# Patient Record
Sex: Male | Born: 1940 | Race: White | Hispanic: No | Marital: Married | Smoking: Never smoker
Health system: Southern US, Community
[De-identification: ages and names within clinical notes are randomized; demographics above are authoritative.]

## PROBLEM LIST (undated history)

## (undated) DIAGNOSIS — I10 Essential (primary) hypertension: Secondary | ICD-10-CM

## (undated) DIAGNOSIS — N200 Calculus of kidney: Secondary | ICD-10-CM

---

## 2008-05-16 HISTORY — PX: KNEE SURGERY: SHX244

## 2008-07-28 ENCOUNTER — Inpatient Hospital Stay (HOSPITAL_COMMUNITY): Admission: RE | Admit: 2008-07-28 | Discharge: 2008-07-31 | Payer: Self-pay | Admitting: Orthopedic Surgery

## 2009-06-10 ENCOUNTER — Ambulatory Visit (HOSPITAL_COMMUNITY): Admission: RE | Admit: 2009-06-10 | Discharge: 2009-06-10 | Payer: Self-pay | Admitting: Orthopedic Surgery

## 2010-08-26 LAB — URINALYSIS, ROUTINE W REFLEX MICROSCOPIC
Bilirubin Urine: NEGATIVE
Glucose, UA: NEGATIVE mg/dL
Ketones, ur: NEGATIVE mg/dL
Leukocytes, UA: NEGATIVE
Protein, ur: NEGATIVE mg/dL
pH: 5.5 (ref 5.0–8.0)

## 2010-08-26 LAB — BASIC METABOLIC PANEL
BUN: 16 mg/dL (ref 6–23)
BUN: 25 mg/dL — ABNORMAL HIGH (ref 6–23)
Calcium: 9.8 mg/dL (ref 8.4–10.5)
Chloride: 108 mEq/L (ref 96–112)
Creatinine, Ser: 1.21 mg/dL (ref 0.4–1.5)
Creatinine, Ser: 1.2 mg/dL (ref 0.4–1.5)
GFR calc Af Amer: >60 mL/min (ref >60)
GFR calc Af Amer: >60 mL/min (ref >60)
GFR calc non Af Amer: >60 mL/min (ref >60)
Glucose, Bld: 118 mg/dL — ABNORMAL HIGH (ref 70–99)

## 2010-08-26 LAB — DIFFERENTIAL
Basophils Absolute: 0 10*3/uL (ref 0.0–0.1)
Eosinophils Absolute: 0.2 10*3/uL (ref 0.0–0.7)
Eosinophils Relative: 4 % (ref 0–5)
Lymphocytes Relative: 25 % (ref 12–46)
Lymphs Abs: 1 10*3/uL (ref 0.7–4.0)
Neutrophils Relative %: 63 % (ref 43–77)

## 2010-08-26 LAB — CBC
HCT: 30.1 % — ABNORMAL LOW (ref 39.0–52.0)
HCT: 33 % — ABNORMAL LOW (ref 39.0–52.0)
HCT: 39.9 % (ref 39.0–52.0)
Hemoglobin: 11.1 g/dL — ABNORMAL LOW (ref 13.0–17.0)
MCHC: 35.3 g/dL (ref 30.0–36.0)
MCHC: 35.9 g/dL (ref 30.0–36.0)
MCV: 95.2 fL (ref 78.0–100.0)
Platelets: 121 10*3/uL — ABNORMAL LOW (ref 150–400)
RBC: 3.23 MIL/uL — ABNORMAL LOW (ref 4.22–5.81)
RBC: 4.19 MIL/uL — ABNORMAL LOW (ref 4.22–5.81)
RDW: 12.6 % (ref 11.5–15.5)
RDW: 12.8 % (ref 11.5–15.5)

## 2010-08-26 LAB — TYPE AND SCREEN

## 2010-08-26 LAB — URINE MICROSCOPIC-ADD ON

## 2010-08-26 LAB — APTT: aPTT: 32 seconds (ref 24–37)

## 2010-09-28 NOTE — Op Note (Signed)
NAME:  Ernest Daniels, Ernest Daniels                  ACCOUNT NO.:  [id number]   MEDICAL RECORD NO.:  [id number]          PATIENT TYPE:  INP   LOCATION:  5035                         FACILITY:  [facility]   PHYSICIAN:  Feliberto Gottron. Turner Daniels, M.D.   DATE OF BIRTH:  [birth year]   DATE OF PROCEDURE:  07/28/2008  DATE OF DISCHARGE:                               OPERATIVE REPORT   PREOPERATIVE DIAGNOSIS:  End-stage arthritis, left knee.   POSTOPERATIVE DIAGNOSIS:  End-stage arthritis, left knee.   PROCEDURE:  Left total knee arthroplasty using DePuy Sigma RP  components, 5 left femur, 6 tibia, 10-mm Sigma RP bearing, 41-mm  patellar button, double batch of DePuy HV cement with 1500 mg Zinacef,  all components cemented.   SURGEON:  Feliberto Gottron. Turner Daniels, MD   FIRST ASSISTANT:  Shirl Harris, PA-C   ANESTHETIC:  General endotracheal.   ESTIMATED BLOOD LOSS:  Minimal.   FLUID REPLACEMENT:  1500 mL of crystalloid.   DRAINS PLACED:  Foley catheter and 2 medium hemostats.   URINE OUTPUT:  300 mL.   TOURNIQUET TIME:  1 hour and 40 minutes.   INDICATIONS FOR PROCEDURE:  A 70 year old gentleman with end-stage  arthritis of the left knee who has been treated conservatively with anti-  inflammatory medicines, physical therapy, cortisone injections, and  viscosupplementation.  He still has persistent unremitting pain.  He  desires left total knee arthroplasty to decrease pain and increase  function.  Plain radiographs show these down to bone-on-bone arthritic  changes.  The risks and benefits of surgery have been discussed.  Questions are answered.  He is prepared for intervention.   DESCRIPTION OF PROCEDURE:  The patient was identified by armband and  underwent a left femoral nerve block anesthetic in the block area at  [facility].  He then received preoperative IV antibiotics and  was taken to the operating room #5 where the appropriate site monitors  were reattached and general endotracheal anesthesia was  induced.  Foley  catheter was inserted.  Tourniquet applied high at the left thigh and  the left lower extremity was then set with a foot positioner and lateral  post, prepped and draped in the usual sterile fashion from the ankle to  the tourniquet.  A standard time-out procedure was performed.  The limb  was wrapped with an Esmarch bandage.  Tourniquet was inflated to 350  mmHg, and we began the procedure by making an anterior midline incision  centered over the patella starting handbreadth above the patella going 1  cm medial to and 3 cm distal to the tibial tubercle.  Small bleeders in  the skin and subcutaneous tissue were identified and cauterized.  The  transverse retinaculum over the patella was incised, reflected medially  allowing a medial parapatellar arthrotomy.  The patella was everted.  Prepatellar fat pad was resected.  Superficial medial collateral  ligament was elevated off the proximal tibia from anterior-posterior  keeping it intact distally and allowing lengthening of the superficial  medial collateral ligament because of the varus deformity.  The knee was  then hyperflexed exposing the  cruciate ligaments which were excised with  electrocautery as were the medial and lateral menisci and the notch was  widened with a 0.5-inch osteotome to allow placement of a posterior  McCulloch retractor through the notch.  A posteromedial Z-retractor was  also placed and finally a lateral Homan retractor.  This enhanced our  proximal tibial exposure.  The proximal tibia was then entered with a  DePuy step drill followed by the IM rod set for 2 degrees posterior  slope cut.  This was pinned into place allowing removal of 7-8 mm of  bone medially and about 10 mm of bone laterally using the power  oscillating saw.  We then entered the distal femur 2 mm anterior to the  PCL origin with the step drill followed by the IM rod set for a 5-degree  left femoral cut because of the relatively  large knee.  We set it for 12  mm resection as well as the 15-degree flexion contracture he had  preoperatively.  After we performed the distal femoral cut, we sized for  a #5 left femoral component and set the cutting guide in 3 degrees of  external rotation.  We then performed our anterior, posterior, and  chamfer cuts without difficulty followed by the Sigma RP box cut after  pinning the box cutting guide into place.  The patella was then measured  at 27 mm.  We thought it would be a large a patella, set the cutting  guide at 16 and removed the posterior 11 mm of the patella sized for 41  button and drilled the lollipop.  We then hyperflexed the knee once  again and sized for 6 tibial baseplate.  The trial was pinned into place  followed by the smokestack in the reamer and then the Delta fin keel  punch.  At this point, we hammered into place a #5 left trial femur,  drilled the lugs and then performed a reduction with a 10-mm Sigma RP  trial bearing and a 41-mm trial patella.  All components tracked  smoothly and no thumb pressure was required for the patella.  The trial  components were removed.  All bony surfaces were WaterPik cleaned and  dried with suction and sponges and a double batch of DePuy HV cement  with 1500 mg of Zinacef was then mixed and applied to all bony and  metallic mating surfaces except for the posterior condyles of the femur  itself.  In order, we hammered into place a #6 tibial baseplate and  removed excess cement, a 5 left femoral component and removed excess  cement, and the clamped the 41-mm patellar button on the patella and  again removed excess cement.  The 10-mm Sigma RP bearing was then  selected, snapped into place, the knee reduced and held in 5 degrees of  flexion with compression as the cement cured.  Medium Hemovac drains  were placed deep in the wound which was then lavaged one more time with  normal saline solution.  We checked our patellar  tracking one more time  after the cement cured and then closed the parapatellar arthrotomy with  running #1 Vicryl suture, the subcutaneous tissue with 0 and 2-0 undyed  Vicryl suture, and skin with skin staples.  A dressing of Xeroform, 4 x  4 dressing sponges, Webril, and Ace wrap was then applied.  The  tourniquet was let down.  The patient awakened and taken to the recovery  room without difficulty.  Feliberto Gottron. Turner Daniels, M.D.  Electronically Signed     FJR/MEDQ  D:  07/28/2008  T:  07/28/2008  Job:  [id number]

## 2010-10-01 NOTE — Discharge Summary (Signed)
NAME:  Ernest Daniels, Ernest Daniels                  ACCOUNT NO.:  [id number]   MEDICAL RECORD NO.:  [id number]          Daniels TYPE:  INP   LOCATION:  5035                         FACILITY:  [facility]   PHYSICIAN:  Feliberto Gottron. Turner Daniels, M.D.   DATE OF BIRTH:  07/16/1940   DATE OF ADMISSION:  07/28/2008  DATE OF DISCHARGE:  07/31/2008                               DISCHARGE SUMMARY   CHIEF COMPLAINT:  Left knee pain.   HISTORY OF PRESENT ILLNESS:  This is a 70 year old gentleman who  complains of unremitting pain in his left knee despite conservative  treatment with antiinflammatory steroid injection, discussed  supplementation and pain medication.  He desires a surgical intervention  at this time.  All risk and benefits of surgery were discussed with Ernest  Daniels.   PAST MEDICAL HISTORY:  Hypertension and kidney stones.   PAST SURGICAL HISTORY:  He had an appendectomy and a hernia repair.   SOCIAL HISTORY:  He denies Ernest use of tobacco or alcohol.   FAMILY HISTORY:  Significant for arthritis.  He has no known drug  allergies.   CURRENT MEDICATIONS:  1. Potassium citrate 1000 mg 1 p.o. b.i.d.  2. Diclofenac 75 mg 1 p.o. daily.  3. Bupropion SR 150 mg 1 p.o. daily.  4. Lisinopril 20 mg 1 p.o. daily.  5. Simvastatin 10 mg 1 p.o. daily.  6. Lorazepam 0.5 mg 1 p.o. daily.  7. Glucosamine 6000 mg 1 p.o. daily.  8. Niacin 2000 mg 1 p.o. daily.  9. Fish oil 1400 mg 1 p.o. daily.  10.Metamucil 1 p.o. daily.   PHYSICAL EXAMINATION:  Gross examination of Ernest left knee demonstrated  Ernest Daniels has a 5-degree flexion contracture.  He has medial joint  line tenderness and has trace effusion.  He is neurovascularly intact.  X-rays demonstrate bone-on-bone degenerative joint disease of Ernest left  knee.   PREOP LABS:  White blood cells 3.9, red blood cells 4.19, hemoglobin  14.4, hematocrit 39.9, and platelet 122.  PT 13.4, INR 1.0, PTT 32.  Sodium 143, potassium 5.0, chloride 108, glucose 142, BUN 25,  creatinine  1.2.  Urinalysis demonstrates small hemoglobin, otherwise within normal  limits.   HOSPITAL COURSE:  Ernest Daniels was admitted to [facility] on July 28, 2008 when he underwent left total knee arthroplasty, that procedure was  performed by Dr. Gean Birchwood and Ernest Daniels tolerated that well.  Two  Hemovac drains were placed into Ernest left knee.  A perioperative Foley  catheter was placed.  Ernest Daniels was placed on Lovenox and Coumadin for  DVT prophylaxis and transferred to Ernest orthopedic floor.  On Ernest first  postoperative day, Ernest Daniels was awake and alert.  He denied any  nausea or vomiting.  He was tolerating p.o. intake well.  Hemoglobin was  11.9.  Surgical drains were removed and his Foley catheter was taken out  after physical therapy.  On Ernest second postoperative day, Ernest Daniels  was awake and alert and eating well.  He had ambulated 100 feet with  physical therapy on Ernest previous day, his  surgical dressing was changed  and this incision was benign.  Hemoglobin was 11.1.  On Ernest third  postoperative day, Ernest Daniels was eating well and ambulating  independently.  His surgical incision remained benign.  Hemoglobin was  10.6 and he was discharged to home.   DISPOSITION:  Ernest Daniels was discharged to home on July 31, 2008.  Home health would manage his wound, Coumadin, and physical therapy.  He  would return to Ernest clinic in 1 week to see Dr. Turner Daniels for x-rays and  staple removal.  He was weightbearing as tolerated.   DISCHARGE MEDICATIONS:  Discharge medicines were as per Ernest HMR with Ernest  addition of Percocet 5 mg 1-2 tabs p.o. q.4 hours p.r.n. pain, Robaxin  500 mg 1 p.o. b.i.d. p.r.n. spasm, and Coumadin to take as directed with  a target INR of 1.5 to 2 for 14 days.   FINAL DIAGNOSIS:  End-stage degenerative joint disease of Ernest left knee.      Shirl Harris, PA      Feliberto Gottron. Turner Daniels, M.D.  Electronically Signed    JW/MEDQ  D:  09/08/2008   T:  09/09/2008  Job:  [id number]

## 2012-09-19 ENCOUNTER — Emergency Department (HOSPITAL_BASED_OUTPATIENT_CLINIC_OR_DEPARTMENT_OTHER)
Admission: EM | Admit: 2012-09-19 | Discharge: 2012-09-19 | Disposition: A | Discharge status: Home / Self Care | Payer: BC Managed Care – PPO | Attending: Emergency Medicine | Admitting: Emergency Medicine

## 2012-09-19 ENCOUNTER — Encounter (HOSPITAL_BASED_OUTPATIENT_CLINIC_OR_DEPARTMENT_OTHER): Payer: Self-pay | Admitting: *Deleted

## 2012-09-19 DIAGNOSIS — R509 Fever, unspecified: Secondary | ICD-10-CM | POA: Insufficient documentation

## 2012-09-19 DIAGNOSIS — J029 Acute pharyngitis, unspecified: Secondary | ICD-10-CM | POA: Insufficient documentation

## 2012-09-19 DIAGNOSIS — Z79899 Other long term (current) drug therapy: Secondary | ICD-10-CM | POA: Insufficient documentation

## 2012-09-19 DIAGNOSIS — I10 Essential (primary) hypertension: Secondary | ICD-10-CM | POA: Insufficient documentation

## 2012-09-19 HISTORY — DX: Essential (primary) hypertension: I10

## 2012-09-19 LAB — BASIC METABOLIC PANEL
CO2: 24 mEq/L (ref 19–32)
Calcium: 9.7 mg/dL (ref 8.4–10.5)
Chloride: 101 mEq/L (ref 96–112)
Glucose, Bld: 108 mg/dL — ABNORMAL HIGH (ref 70–99)
Sodium: 137 mEq/L (ref 135–145)

## 2012-09-19 LAB — CBC WITH DIFFERENTIAL/PLATELET
Basophils Relative: 0 % (ref 0–1)
Eosinophils Absolute: 0.3 10*3/uL (ref 0.0–0.7)
Eosinophils Relative: 3 % (ref 0–5)
Lymphocytes Relative: 7 % — ABNORMAL LOW (ref 12–46)
Neutrophils Relative %: 83 % — ABNORMAL HIGH (ref 43–77)
WBC: 11.2 10*3/uL — ABNORMAL HIGH (ref 4.0–10.5)

## 2012-09-19 LAB — RAPID STREP SCREEN (MED CTR MEBANE ONLY): Streptococcus, Group A Screen (Direct): NEGATIVE

## 2012-09-19 LAB — MONONUCLEOSIS SCREEN: Mono Screen: NEGATIVE

## 2012-09-19 MED ORDER — NYSTATIN 100000 UNIT/ML MT SUSP: 500000.0000 [IU] | Freq: Four times a day (QID) | OROMUCOSAL | Status: DC

## 2012-09-19 MED ORDER — MAGIC MOUTHWASH W/LIDOCAINE: 10.0000 mL | Freq: Four times a day (QID) | ORAL | Status: DC | PRN

## 2012-09-19 MED ORDER — DEXAMETHASONE SODIUM PHOSPHATE 10 MG/ML IJ SOLN
10.0000 mg | Freq: Once | INTRAMUSCULAR | Status: AC
  Administered 2012-09-19: 10 mg via INTRAVENOUS
  Filled 2012-09-19: qty 1

## 2012-09-19 MED ORDER — AMOXICILLIN-POT CLAVULANATE 875-125 MG PO TABS: 1.0000 | ORAL_TABLET | Freq: Two times a day (BID) | ORAL | Status: DC

## 2012-09-19 MED ORDER — SODIUM CHLORIDE 0.9 % IV BOLUS (SEPSIS)
500.0000 mL | Freq: Once | INTRAVENOUS | Status: AC
  Administered 2012-09-19: 500 mL via INTRAVENOUS

## 2012-09-19 MED ORDER — DEXTROSE 5 % IV SOLN
1.0000 g | Freq: Once | INTRAVENOUS | Status: AC
  Administered 2012-09-19: 1 g via INTRAVENOUS
  Filled 2012-09-19: qty 10

## 2012-09-19 NOTE — ED Notes (Signed)
Sore throat fever chills x 2-3 days unable to sleep at night

## 2012-09-19 NOTE — ED Provider Notes (Signed)
History     CSN: [id number]  Arrival date & time 09/19/12  0704   First MD Initiated Contact with Patient 09/19/12 0732      Chief Complaint  Patient presents with  . Sore Throat  . Fever    (Consider location/radiation/quality/duration/timing/severity/associated sxs/prior treatment) HPI Comments: Patient presents to the ER for evaluation of sore throat. Patient reports that his symptoms began approximately 3 days ago. He did see his doctor and had a rapid strep test performed. He reports that was negative. He has been using hydrocodone, but the pain is not controlled. He had trouble sleeping last night. He reports that the pain started on the left side, but now is the entire throat and has become severe. It hurts more when he swallows. He has been having fever and chills. He reports that he had bronchitis 2 weeks ago, was not treated with antibiotics.  Patient is a 72 y.o. male presenting with pharyngitis and fever.  Sore Throat  Fever Associated symptoms: sore throat     Past Medical History  Diagnosis Date  . Hypertension     History reviewed. No pertinent past surgical history.  History reviewed. No pertinent family history.  History  Substance Use Topics  . Smoking status: Never Smoker   . Smokeless tobacco: Not on file  . Alcohol Use: Yes      Review of Systems  Constitutional: Positive for fever.  HENT: Positive for sore throat.   All other systems reviewed and are negative.    Allergies  Review of patient's allergies indicates no known allergies.  Home Medications   Current Outpatient Rx  Name  Route  Sig  Dispense  Refill  . amLODipine (NORVASC) 5 MG tablet   Oral   Take 5 mg by mouth daily.         . diclofenac (VOLTAREN) 25 MG EC tablet   Oral   Take 25 mg by mouth 2 (two) times daily.         . diphenhydrAMINE (BENADRYL) 50 MG tablet   Oral   Take 50 mg by mouth at bedtime as needed for itching.         Marland Kitchen LORazepam (ATIVAN) 1 MG  tablet   Oral   Take 1 mg by mouth every 8 (eight) hours.         . potassium citrate (UROCIT-K) 10 MEQ (1080 MG) SR tablet   Oral   Take 10 mEq by mouth 3 (three) times daily with meals.         . simvastatin (ZOCOR) 40 MG tablet   Oral   Take 40 mg by mouth every evening.           BP 144/83  Pulse 94  Temp(Src) 100.1 F (37.8 C) (Oral)  Resp 18  Ht 5\' 11"  (1.803 m)  Wt 210 lb (95.255 kg)  BMI 29.3 kg/m2  SpO2 97%  Physical Exam  Constitutional: He is oriented to person, place, and time. He appears well-developed and well-nourished. No distress.  HENT:  Head: Normocephalic and atraumatic.  Right Ear: Hearing, tympanic membrane and ear canal normal.  Left Ear: Hearing, tympanic membrane and ear canal normal.  Nose: Nose normal.  Mouth/Throat: Mucous membranes are normal. Oropharyngeal exudate and posterior oropharyngeal erythema present. No tonsillar abscesses.    Eyes: Conjunctivae and EOM are normal. Pupils are equal, round, and reactive to light.  Neck: Normal range of motion. Neck supple.  Cardiovascular: Normal rate, regular rhythm, S1 normal and  S2 normal.  Exam reveals no gallop and no friction rub.   No murmur heard. Pulmonary/Chest: Effort normal and breath sounds normal. No respiratory distress. He exhibits no tenderness.  Abdominal: Soft. Normal appearance and bowel sounds are normal. There is no hepatosplenomegaly. There is no tenderness. There is no rebound, no guarding, no tenderness at McBurney's point and negative Murphy's sign. No hernia.  Musculoskeletal: Normal range of motion.  Neurological: He is alert and oriented to person, place, and time. He has normal strength. No cranial nerve deficit or sensory deficit. Coordination normal. GCS eye subscore is 4. GCS verbal subscore is 5. GCS motor subscore is 6.  Skin: Skin is warm, dry and intact. No rash noted. No cyanosis.  Psychiatric: He has a normal mood and affect. His speech is normal and behavior  is normal. Thought content normal.    ED Course  Procedures (including critical care time)  Labs Reviewed  CBC WITH DIFFERENTIAL - Abnormal; Notable for the following:    WBC 11.2 (*)    MCH 34.2 (*)    MCHC 37.6 (*)    Platelets 138 (*)    Neutrophils Relative 83 (*)    Lymphocytes Relative 7 (*)    Neutro Abs 9.3 (*)    All other components within normal limits  BASIC METABOLIC PANEL - Abnormal; Notable for the following:    Glucose, Bld 108 (*)    BUN 24 (*)    GFR calc non Af Amer 54 (*)    GFR calc Af Amer 63 (*)    All other components within normal limits  RAPID STREP SCREEN  STREP A DNA PROBE  MONONUCLEOSIS SCREEN   No results found.   Diagnosis: Pharyngitis    MDM  Patient comes to the ER with complaints of the febrile illness with progressively worsening sore throat. Examination revealed diffuse exudate in the areas of the tonsil and soft palate. With patient having a fever, bacterial and viral infection are most likely. Based on the morphology, cannot rule out candidal infection, the patient does not have any reason for fungal infection of the throat. That stress is negative, DNA probe is pending. Monospot was negative. Patient administered IV fluids, Rocephin and Decadron. Will continue Augmentin as an outpatient. Patient has hydrocodone to use for the pain. Will add Magic mouthwash. In addition, as the patient was given steroids here, will cover for fungal etiology with nystatin. Followup with primary doctor. Return to the ER if symptoms worsen.        Gilda Crease, MD 09/19/12 [phone]

## 2012-09-20 LAB — STREP A DNA PROBE: Group A Strep Probe: NEGATIVE

## 2012-11-28 ENCOUNTER — Emergency Department (HOSPITAL_BASED_OUTPATIENT_CLINIC_OR_DEPARTMENT_OTHER): Payer: BC Managed Care – PPO

## 2012-11-28 ENCOUNTER — Emergency Department (HOSPITAL_BASED_OUTPATIENT_CLINIC_OR_DEPARTMENT_OTHER)
Admission: EM | Admit: 2012-11-28 | Discharge: 2012-11-28 | Disposition: A | Discharge status: Home / Self Care | Payer: BC Managed Care – PPO | Attending: Emergency Medicine | Admitting: Emergency Medicine

## 2012-11-28 ENCOUNTER — Encounter (HOSPITAL_BASED_OUTPATIENT_CLINIC_OR_DEPARTMENT_OTHER): Payer: Self-pay

## 2012-11-28 DIAGNOSIS — Z79899 Other long term (current) drug therapy: Secondary | ICD-10-CM | POA: Insufficient documentation

## 2012-11-28 DIAGNOSIS — M542 Cervicalgia: Secondary | ICD-10-CM | POA: Insufficient documentation

## 2012-11-28 DIAGNOSIS — M549 Dorsalgia, unspecified: Secondary | ICD-10-CM | POA: Insufficient documentation

## 2012-11-28 DIAGNOSIS — I10 Essential (primary) hypertension: Secondary | ICD-10-CM | POA: Insufficient documentation

## 2012-11-28 DIAGNOSIS — Z792 Long term (current) use of antibiotics: Secondary | ICD-10-CM | POA: Insufficient documentation

## 2012-11-28 MED ORDER — IBUPROFEN 400 MG PO TABS
600.0000 mg | ORAL_TABLET | Freq: Once | ORAL | Status: AC
  Administered 2012-11-28: 600 mg via ORAL
  Filled 2012-11-28: qty 1

## 2012-11-28 MED ORDER — DIAZEPAM 5 MG PO TABS: 5.0000 mg | ORAL_TABLET | Freq: Four times a day (QID) | ORAL | Status: DC | PRN

## 2012-11-28 MED ORDER — IBUPROFEN 600 MG PO TABS: 600.0000 mg | ORAL_TABLET | Freq: Four times a day (QID) | ORAL | Status: DC | PRN

## 2012-11-28 MED ORDER — OXYCODONE-ACETAMINOPHEN 5-325 MG PO TABS: 1.0000 | ORAL_TABLET | Freq: Four times a day (QID) | ORAL | Status: DC | PRN

## 2012-11-28 MED ORDER — OXYCODONE-ACETAMINOPHEN 5-325 MG PO TABS
1.0000 | ORAL_TABLET | Freq: Once | ORAL | Status: AC
  Administered 2012-11-28: 1 via ORAL
  Filled 2012-11-28 (×2): qty 1

## 2012-11-28 MED ORDER — DIAZEPAM 5 MG PO TABS
5.0000 mg | ORAL_TABLET | Freq: Once | ORAL | Status: AC
  Administered 2012-11-28: 5 mg via ORAL
  Filled 2012-11-28: qty 1

## 2012-11-28 NOTE — ED Provider Notes (Signed)
History    CSN: [id number] Arrival date & time 11/28/12  2105  First MD Initiated Contact with Patient 11/28/12 2138     Chief Complaint  Patient presents with  . Neck Pain   (Consider location/radiation/quality/duration/timing/severity/associated sxs/prior Treatment) Patient is a 72 y.o. male presenting with neck pain. The history is provided by the patient.  Neck Pain Associated symptoms: no chest pain, no headaches, no numbness and no weakness    patient presents with neck pain. He states it is in the back of his neck towards his head. It is worse with moving his head and states he cannot move his head as much. No fevers. No difficulty seen. No numbness or weakness. No trauma. He states he woke up with some pain this morning. He states it got worse after he went to the chiropractor. He's had some muscle spasms previously. Past Medical History  Diagnosis Date  . Hypertension    History reviewed. No pertinent past surgical history. No family history on file. History  Substance Use Topics  . Smoking status: Never Smoker   . Smokeless tobacco: Not on file  . Alcohol Use: No    Review of Systems  Constitutional: Negative for activity change and appetite change.  HENT: Positive for neck pain. Negative for neck stiffness.   Eyes: Negative for pain.  Respiratory: Negative for chest tightness and shortness of breath.   Cardiovascular: Negative for chest pain and leg swelling.  Gastrointestinal: Negative for nausea, vomiting, abdominal pain and diarrhea.  Genitourinary: Negative for flank pain.  Musculoskeletal: Negative for back pain.  Skin: Negative for rash.  Neurological: Negative for weakness, numbness and headaches.  Psychiatric/Behavioral: Negative for behavioral problems.    Allergies  Review of patient's allergies indicates no known allergies.  Home Medications   Current Outpatient Rx  Name  Route  Sig  Dispense  Refill  . BUPROPION HCL ER, SR, PO   Oral   Take  150 mg by mouth.         Marland Kitchen glucosamine-chondroitin 500-400 MG tablet   Oral   Take 1 tablet by mouth 3 (three) times daily.         . Multiple Vitamins-Minerals (CENTRUM SILVER PO)   Oral   Take by mouth.         Marland Kitchen NIACIN CR PO   Oral   Take by mouth.         . Alum & Mag Hydroxide-Simeth (MAGIC MOUTHWASH W/LIDOCAINE) SOLN   Oral   Take 10 mLs by mouth 4 (four) times daily as needed.   240 mL   0   . amLODipine (NORVASC) 5 MG tablet   Oral   Take 5 mg by mouth daily.         Marland Kitchen amoxicillin-clavulanate (AUGMENTIN) 875-125 MG per tablet   Oral   Take 1 tablet by mouth every 12 (twelve) hours.   20 tablet   0   . diazepam (VALIUM) 5 MG tablet   Oral   Take 1 tablet (5 mg total) by mouth every 6 (six) hours as needed (spasm).   10 tablet   0   . diclofenac (VOLTAREN) 25 MG EC tablet   Oral   Take 25 mg by mouth 2 (two) times daily.         . diphenhydrAMINE (BENADRYL) 50 MG tablet   Oral   Take 50 mg by mouth at bedtime as needed for itching.         Marland Kitchen  ibuprofen (ADVIL,MOTRIN) 600 MG tablet   Oral   Take 1 tablet (600 mg total) by mouth every 6 (six) hours as needed for pain.   10 tablet   0   . LORazepam (ATIVAN) 1 MG tablet   Oral   Take 1 mg by mouth every 8 (eight) hours.         Marland Kitchen nystatin (MYCOSTATIN) 100000 UNIT/ML suspension   Oral   Take 5 mLs (500,000 Units total) by mouth 4 (four) times daily.   200 mL   0   . oxyCODONE-acetaminophen (PERCOCET/ROXICET) 5-325 MG per tablet   Oral   Take 1-2 tablets by mouth every 6 (six) hours as needed for pain.   10 tablet   0   . potassium citrate (UROCIT-K) 10 MEQ (1080 MG) SR tablet   Oral   Take 10 mEq by mouth 3 (three) times daily with meals.         . simvastatin (ZOCOR) 40 MG tablet   Oral   Take 40 mg by mouth every evening.          BP 165/86  Pulse 74  Temp(Src) 98.1 F (36.7 C) (Oral)  Resp 20  Ht 5\' 10"  (1.778 m)  Wt 205 lb (92.987 kg)  BMI 29.41 kg/m2  SpO2  98% Physical Exam  Nursing note and vitals reviewed. Constitutional: He is oriented to person, place, and time. He appears well-developed and well-nourished.  HENT:  Head: Normocephalic and atraumatic.  Eyes: EOM are normal. Pupils are equal, round, and reactive to light.  Neck: Normal range of motion. Neck supple.  Tenderness over superior posterior left neck. No rash.  Cardiovascular: Normal rate, regular rhythm and normal heart sounds.   No murmur heard. Pulmonary/Chest: Effort normal and breath sounds normal.  Abdominal: Soft. Bowel sounds are normal. He exhibits no distension and no mass. There is no tenderness. There is no rebound and no guarding.  Musculoskeletal: Normal range of motion. He exhibits no edema.  Neurological: He is alert and oriented to person, place, and time. No cranial nerve deficit.  Skin: Skin is warm and dry.  Psychiatric: He has a normal mood and affect.    ED Course  Procedures (including critical care time) Labs Reviewed - No data to display Dg Cervical Spine Complete  11/28/2012   *RADIOLOGY REPORT*  Clinical Data: Left upper neck pain.  No injury.  CERVICAL SPINE - COMPLETE 4+ VIEW  Comparison: None.  Findings: AP, lateral, obliques and odontoid view of the cervical spine were obtained.  Grade 1 anterolisthesis at C4-C5.  Severe disc space narrowing at C5-C6 and C6-C7.  Alignment at the cervicothoracic junction is within normal limits.  Prevertebral soft tissues are normal.  Bony encroachment of the right neural foramen at C5-C6.  Bony encroachment of the left neural foramen at C3-C4.  IMPRESSION: Multilevel cervical spondylosis.  No acute bony abnormality.   Original Report Authenticated By: Richarda Overlie, M.D.   1. Neck pain on left side     MDM  Patient with back pain. X-Garnie shows spondylosis.  Juliet Rude. Rubin Payor, MD 11/28/12 [phone]

## 2012-11-28 NOTE — ED Notes (Signed)
MD at bedside giving test results and plan of care for DC. 

## 2012-11-28 NOTE — ED Notes (Signed)
Pt with steady gait to tx area.

## 2012-11-28 NOTE — ED Notes (Addendum)
Pt reports woke with neck pain this am-denies injury-was seen by chiropractor-c/o increase in pain and decrease in movement

## 2012-11-28 NOTE — ED Notes (Signed)
Patient transported to [facility] 

## 2012-12-06 ENCOUNTER — Ambulatory Visit (INDEPENDENT_AMBULATORY_CARE_PROVIDER_SITE_OTHER): Payer: BC Managed Care – PPO | Admitting: Family Medicine

## 2012-12-06 ENCOUNTER — Encounter: Payer: Self-pay | Admitting: Family Medicine

## 2012-12-06 VITALS — BP 124/73 | HR 70 | Ht 70.0 in | Wt 205.0 lb

## 2012-12-06 DIAGNOSIS — M542 Cervicalgia: Secondary | ICD-10-CM

## 2012-12-06 NOTE — Patient Instructions (Addendum)
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 Continue your voltaren as you have been for pain and inflammation. Consider tylenol 500mg  1-2 tabs three times a day for pain. Glucosamine sulfate 750mg  twice a day is a supplement that has been shown to help moderate to severe arthritis. Capsaicin topically up to four times a day may also help with pain. Consider cervical collar if severely painful. Simple range of motion exercises within limits of pain to prevent further stiffness. Start physical therapy for stretching, exercises, traction, and modalities. Heat 15 minutes at a time 3-4 times a day to help with spasms. Watch head position when on computers, texting, when sleeping in bed. If not improving we will consider an MRI. Follow up with me in 4-6 weeks.

## 2012-12-07 ENCOUNTER — Encounter: Payer: Self-pay | Admitting: Family Medicine

## 2012-12-07 DIAGNOSIS — M542 Cervicalgia: Secondary | ICD-10-CM | POA: Insufficient documentation

## 2012-12-07 NOTE — Progress Notes (Signed)
Patient ID: Ernest Daniels, male   DOB: August 31, 1940, 72 y.o.   MRN: [id number]  PCP: Susa Loffler MD  Subjective:   HPI: Patient is a 72 y.o. male here for neck pain.  Patient reports no injury or trauma. States about 2 months ago he woke up one day with neck pain and stiffness - more to right side of center. Had difficulty with motion - went to chiropractor which helped. Then last Wednesday had same symptoms come up - worse with chiropractic care though so went to ED. Has tried tylenol and glucosamine on his own.  From ED tried valium and oxycodone. Takes voltaren for arthritis regularly. No radiation into arms. No numbness or tingling. No bowel/bladder dysfunction.  Past Medical History  Diagnosis Date  . Hypertension     Current Outpatient Prescriptions on File Prior to Visit  Medication Sig Dispense Refill  . Alum & Mag Hydroxide-Simeth (MAGIC MOUTHWASH W/LIDOCAINE) SOLN Take 10 mLs by mouth 4 (four) times daily as needed.  240 mL  0  . amLODipine (NORVASC) 5 MG tablet Take 5 mg by mouth daily.      . BUPROPION HCL ER, SR, PO Take 150 mg by mouth.      . diazepam (VALIUM) 5 MG tablet Take 1 tablet (5 mg total) by mouth every 6 (six) hours as needed (spasm).  10 tablet  0  . diclofenac (VOLTAREN) 25 MG EC tablet Take 25 mg by mouth 2 (two) times daily.      . diphenhydrAMINE (BENADRYL) 50 MG tablet Take 50 mg by mouth at bedtime as needed for itching.      Marland Kitchen glucosamine-chondroitin 500-400 MG tablet Take 1 tablet by mouth 3 (three) times daily.      . Multiple Vitamins-Minerals (CENTRUM SILVER PO) Take by mouth.      Marland Kitchen NIACIN CR PO Take by mouth.      . potassium citrate (UROCIT-K) 10 MEQ (1080 MG) SR tablet Take 10 mEq by mouth 3 (three) times daily with meals.      . simvastatin (ZOCOR) 40 MG tablet Take 40 mg by mouth every evening.       No current facility-administered medications on file prior to visit.    Past Surgical History  Procedure Laterality Date  . Knee surgery   2010    replacement    No Known Allergies  History   Social History  . Marital Status: Married    Spouse Name: N/A    Number of Children: N/A  . Years of Education: N/A   Occupational History  . Not on file.   Social History Main Topics  . Smoking status: Never Smoker   . Smokeless tobacco: Not on file  . Alcohol Use: No  . Drug Use: No  . Sexually Active: Not on file   Other Topics Concern  . Not on file   Social History Narrative  . No narrative on file    Family History  Problem Relation Age of Onset  . Hypertension Mother   . Diabetes Neg Hx   . Heart attack Neg Hx   . Hyperlipidemia Neg Hx   . Sudden death Neg Hx     BP 124/73  Pulse 70  Ht 5\' 10"  (1.778 m)  Wt 205 lb (92.987 kg)  BMI 29.41 kg/m2  Review of Systems: See HPI above.    Objective:  Physical Exam:  Gen: NAD  Neck: No gross deformity, swelling, bruising. TTP mild just right of midline cervical  paraspinal region.  No midline/bony TTP. Lacks 10 degrees bilateral lateral rotation.  Only has 5 degrees extension, full flexion.  Pain with bilateral lateral rotations and extension. BUE strength 5/5.   Sensation intact to light touch.   2+ equal reflexes in triceps, biceps, brachioradialis tendons. Negative spurlings. NV intact distal BUEs.    Assessment & Plan:  1. Neck pain - 2/2 arthritis and secondary spasms.  Radiographs negative for fracture or other acute abnormalities.  Continue voltaren, glucosamine.  Consider adding tylenol, capsaicin.  Start formal PT and home exercise program.  Discussed ergonomic issues.  Heat as needed for spasms.  F/u in 4-6 weeks for reevaluation.

## 2012-12-07 NOTE — Assessment & Plan Note (Signed)
2/2 arthritis and secondary spasms.  Radiographs negative for fracture or other acute abnormalities.  Continue voltaren, glucosamine.  Consider adding tylenol, capsaicin.  Start formal PT and home exercise program.  Discussed ergonomic issues.  Heat as needed for spasms.  F/u in 4-6 weeks for reevaluation.

## 2012-12-11 ENCOUNTER — Ambulatory Visit: Payer: BC Managed Care – PPO | Attending: Family Medicine | Admitting: Physical Therapy

## 2012-12-11 DIAGNOSIS — IMO0001 Reserved for inherently not codable concepts without codable children: Secondary | ICD-10-CM | POA: Insufficient documentation

## 2012-12-11 DIAGNOSIS — R293 Abnormal posture: Secondary | ICD-10-CM | POA: Insufficient documentation

## 2012-12-11 DIAGNOSIS — M542 Cervicalgia: Secondary | ICD-10-CM | POA: Insufficient documentation

## 2012-12-18 ENCOUNTER — Ambulatory Visit: Payer: BC Managed Care – PPO | Admitting: Physical Therapy

## 2012-12-19 ENCOUNTER — Ambulatory Visit: Payer: BC Managed Care – PPO | Attending: Family Medicine | Admitting: Physical Therapy

## 2012-12-19 DIAGNOSIS — R293 Abnormal posture: Secondary | ICD-10-CM | POA: Insufficient documentation

## 2012-12-19 DIAGNOSIS — IMO0001 Reserved for inherently not codable concepts without codable children: Secondary | ICD-10-CM | POA: Insufficient documentation

## 2012-12-19 DIAGNOSIS — M542 Cervicalgia: Secondary | ICD-10-CM | POA: Insufficient documentation

## 2012-12-26 ENCOUNTER — Ambulatory Visit: Payer: BC Managed Care – PPO | Admitting: Physical Therapy

## 2013-01-01 ENCOUNTER — Ambulatory Visit: Payer: BC Managed Care – PPO | Admitting: Physical Therapy

## 2013-01-08 ENCOUNTER — Ambulatory Visit: Payer: BC Managed Care – PPO | Admitting: Physical Therapy

## 2013-01-15 ENCOUNTER — Ambulatory Visit: Payer: BC Managed Care – PPO | Admitting: Family Medicine

## 2016-09-03 ENCOUNTER — Encounter (HOSPITAL_BASED_OUTPATIENT_CLINIC_OR_DEPARTMENT_OTHER): Payer: Self-pay | Admitting: Emergency Medicine

## 2016-09-03 DIAGNOSIS — N201 Calculus of ureter: Secondary | ICD-10-CM | POA: Insufficient documentation

## 2016-09-03 DIAGNOSIS — R109 Unspecified abdominal pain: Secondary | ICD-10-CM | POA: Diagnosis present

## 2016-09-03 DIAGNOSIS — I10 Essential (primary) hypertension: Secondary | ICD-10-CM | POA: Insufficient documentation

## 2016-09-03 DIAGNOSIS — Z79899 Other long term (current) drug therapy: Secondary | ICD-10-CM | POA: Diagnosis not present

## 2016-09-03 NOTE — ED Triage Notes (Signed)
PT presents to ED with c/o left flank pain that started today.

## 2016-09-04 ENCOUNTER — Emergency Department (HOSPITAL_BASED_OUTPATIENT_CLINIC_OR_DEPARTMENT_OTHER)
Admission: EM | Admit: 2016-09-04 | Discharge: 2016-09-04 | Disposition: A | Discharge status: Home / Self Care | Payer: BLUE CROSS/BLUE SHIELD | Attending: Emergency Medicine | Admitting: Emergency Medicine

## 2016-09-04 ENCOUNTER — Emergency Department (HOSPITAL_BASED_OUTPATIENT_CLINIC_OR_DEPARTMENT_OTHER): Payer: BLUE CROSS/BLUE SHIELD

## 2016-09-04 DIAGNOSIS — N201 Calculus of ureter: Secondary | ICD-10-CM

## 2016-09-04 HISTORY — DX: Calculus of kidney: N20.0

## 2016-09-04 LAB — BASIC METABOLIC PANEL
ANION GAP: 11 (ref 5–15)
BUN: 26 mg/dL — ABNORMAL HIGH (ref 6–20)
CO2: 21 mmol/L — AB (ref 22–32)
Calcium: 9.5 mg/dL (ref 8.9–10.3)
Chloride: 106 mmol/L (ref 101–111)
Creatinine, Ser: 1.24 mg/dL (ref 0.61–1.24)
GFR calc non Af Amer: 56 mL/min — ABNORMAL LOW (ref >60)
Glucose, Bld: 141 mg/dL — ABNORMAL HIGH (ref 65–99)
POTASSIUM: 3.8 mmol/L (ref 3.5–5.1)
Sodium: 138 mmol/L (ref 135–145)

## 2016-09-04 LAB — URINALYSIS, ROUTINE W REFLEX MICROSCOPIC
Bilirubin Urine: NEGATIVE
GLUCOSE, UA: NEGATIVE mg/dL
KETONES UR: 15 mg/dL — AB
Leukocytes, UA: NEGATIVE
Nitrite: NEGATIVE
PH: 7.5 (ref 5.0–8.0)
Protein, ur: 100 mg/dL — AB
Specific Gravity, Urine: 1.01 (ref 1.005–1.030)

## 2016-09-04 LAB — URINALYSIS, MICROSCOPIC (REFLEX)

## 2016-09-04 MED ORDER — TAMSULOSIN HCL 0.4 MG PO CAPS
0.4000 mg | ORAL_CAPSULE | Freq: Once | ORAL | Status: AC
  Administered 2016-09-04: 0.4 mg via ORAL
  Filled 2016-09-04: qty 1

## 2016-09-04 MED ORDER — ONDANSETRON 8 MG PO TBDP: 8.0000 mg | ORAL_TABLET | Freq: Three times a day (TID) | ORAL | 1 refills | Status: DC | PRN

## 2016-09-04 MED ORDER — ONDANSETRON HCL 4 MG/2ML IJ SOLN
4.0000 mg | Freq: Once | INTRAMUSCULAR | Status: AC
  Administered 2016-09-04: 4 mg via INTRAVENOUS
  Filled 2016-09-04: qty 2

## 2016-09-04 MED ORDER — TAMSULOSIN HCL 0.4 MG PO CAPS: ORAL_CAPSULE | ORAL | 0 refills | Status: DC

## 2016-09-04 MED ORDER — SODIUM CHLORIDE 0.9 % IV SOLN
Freq: Once | INTRAVENOUS | Status: AC
  Administered 2016-09-04: 03:00:00 via INTRAVENOUS

## 2016-09-04 MED ORDER — FENTANYL CITRATE (PF) 100 MCG/2ML IJ SOLN
100.0000 ug | Freq: Once | INTRAMUSCULAR | Status: AC
  Administered 2016-09-04: 100 ug via INTRAVENOUS
  Filled 2016-09-04: qty 2

## 2016-09-04 MED ORDER — OXYCODONE-ACETAMINOPHEN 5-325 MG PO TABS: 1.0000 | ORAL_TABLET | ORAL | 0 refills | Status: DC | PRN

## 2016-09-04 NOTE — ED Notes (Signed)
Strainer sent home with pt. Instructions provided and pt voiced understanding.

## 2016-09-04 NOTE — ED Provider Notes (Addendum)
[facility] Provider Note: Ernest Dell, MD, FACEP  CSN: [id number] MRN: [id number] ARRIVAL: 09/03/16 at 2217 ROOM: [facility]   CHIEF COMPLAINT  Flank Pain   HISTORY OF PRESENT ILLNESS  Ernest Daniels is a 76 y.o. male with a history of kidney stones. He is here with pain in the left flank radiating to his left lower quadrant. The pain began yesterday afternoon about 4 PM. He rates the pain as an 8 out of 10 and describes it as a dull pain. It is similar to previous kidney stones. He has had no associated fever, chills, nausea, vomiting, dysuria or gross hematuria. Pain is somewhat worse with palpation of the abdomen.  Consultation with the [facility] state controlled substances database reveals the patient has received no opioid prescriptions in the past year.   Past Medical History:  Diagnosis Date  . Hypertension   . Kidney stone     Past Surgical History:  Procedure Laterality Date  . KNEE SURGERY  2010   replacement    Family History  Problem Relation Age of Onset  . Hypertension Mother   . Diabetes Neg Hx   . Heart attack Neg Hx   . Hyperlipidemia Neg Hx   . Sudden death Neg Hx     Social History  Substance Use Topics  . Smoking status: Never Smoker  . Smokeless tobacco: Not on file  . Alcohol use No    Prior to Admission medications   Medication Sig Start Date End Date Taking? Authorizing Provider  Alum & Mag Hydroxide-Simeth (MAGIC MOUTHWASH W/LIDOCAINE) SOLN Take 10 mLs by mouth 4 (four) times daily as needed. 09/19/12   Gilda Crease, MD  amLODipine (NORVASC) 5 MG tablet Take 5 mg by mouth daily.    Historical Provider, MD  BUPROPION HCL ER, SR, PO Take 150 mg by mouth.    Historical Provider, MD  diazepam (VALIUM) 5 MG tablet Take 1 tablet (5 mg total) by mouth every 6 (six) hours as needed (spasm). 11/28/12   Benjiman Core, MD  diclofenac (VOLTAREN) 25 MG EC tablet Take 25 mg by mouth 2 (two) times daily.    Historical Provider,  MD  diphenhydrAMINE (BENADRYL) 50 MG tablet Take 50 mg by mouth at bedtime as needed for itching.    Historical Provider, MD  glucosamine-chondroitin 500-400 MG tablet Take 1 tablet by mouth 3 (three) times daily.    Historical Provider, MD  Multiple Vitamins-Minerals (CENTRUM SILVER PO) Take by mouth.    Historical Provider, MD  NIACIN CR PO Take by mouth.    Historical Provider, MD  potassium citrate (UROCIT-K) 10 MEQ (1080 MG) SR tablet Take 10 mEq by mouth 3 (three) times daily with meals.    Historical Provider, MD  simvastatin (ZOCOR) 40 MG tablet Take 40 mg by mouth every evening.    Historical Provider, MD    Allergies Patient has no known allergies.   REVIEW OF SYSTEMS  Negative except as noted here or in the History of Present Illness.   PHYSICAL EXAMINATION  Initial Vital Signs Blood pressure (!) 178/99, pulse 85, temperature 98 F (36.7 C), temperature source Oral, resp. rate 14, height  (1.778 m), weight 220 lb (99.8 kg), SpO2 95 %.  Examination General: Well-developed, well-nourished male in no acute distress; appearance consistent with age of record HENT: normocephalic; atraumatic Eyes: pupils equal, round and reactive to light; extraocular muscles intact Neck: supple Heart: regular rate and rhythm Lungs: clear to auscultation bilaterally Abdomen:  soft; nondistended; left lower quadrant tenderness; no masses or hepatosplenomegaly; bowel sounds present GU: No CVA tenderness Extremities: No deformity; full range of motion; pulses normal Neurologic: Awake, alert and oriented; motor function intact in all extremities and symmetric; no facial droop Skin: Warm and dry Psychiatric: Flat affect   RESULTS  Summary of this visit's results, reviewed by myself:   EKG Interpretation  Date/Time:    Ventricular Rate:    PR Interval:    QRS Duration:   QT Interval:    QTC Calculation:   R Axis:     Text Interpretation:        Laboratory Studies: Results for  orders placed or performed during the hospital encounter of 09/04/16 (from the past 24 hour(s))  Urinalysis, Routine w reflex microscopic     Status: Abnormal   Collection Time: 09/04/16 12:32 AM  Result Value Ref Range   Color, Urine YELLOW YELLOW   APPearance CLEAR CLEAR   Specific Gravity, Urine 1.010 1.005 - 1.030   pH 7.5 5.0 - 8.0   Glucose, UA NEGATIVE NEGATIVE mg/dL   Hgb urine dipstick LARGE (A) NEGATIVE   Bilirubin Urine NEGATIVE NEGATIVE   Ketones, ur 15 (A) NEGATIVE mg/dL   Protein, ur 161 (A) NEGATIVE mg/dL   Nitrite NEGATIVE NEGATIVE   Leukocytes, UA NEGATIVE NEGATIVE  Urinalysis, Microscopic (reflex)     Status: Abnormal   Collection Time: 09/04/16 12:32 AM  Result Value Ref Range   RBC / HPF 6-30 0 - 5 RBC/hpf   WBC, UA 0-5 0 - 5 WBC/hpf   Bacteria, UA RARE (A) NONE SEEN   Squamous Epithelial / LPF 0-5 (A) NONE SEEN  Basic metabolic panel     Status: Abnormal   Collection Time: 09/04/16  2:38 AM  Result Value Ref Range   Sodium 138 135 - 145 mmol/L   Potassium 3.8 3.5 - 5.1 mmol/L   Chloride 106 101 - 111 mmol/L   CO2 21 (L) 22 - 32 mmol/L   Glucose, Bld 141 (H) 65 - 99 mg/dL   BUN 26 (H) 6 - 20 mg/dL   Creatinine, Ser 0.96 0.61 - 1.24 mg/dL   Calcium 9.5 8.9 - 04.5 mg/dL   GFR calc non Af Amer 56 (L) >60 mL/min   GFR calc Af Amer >60 >60 mL/min   Anion gap 11 5 - 15   Imaging Studies: Ct Renal Stone Study  Result Date: 09/04/2016 CLINICAL DATA:  Left flank pain for 1 day EXAM: CT ABDOMEN AND PELVIS WITHOUT CONTRAST TECHNIQUE: Multidetector CT imaging of the abdomen and pelvis was performed following the standard protocol without IV contrast. COMPARISON:  01/01/2015 FINDINGS: Lower chest: Lung bases demonstrate no acute infiltrate or effusion. Normal heart size. Coronary artery calcification. Hepatobiliary: Calcified granuloma. No calcified gallstones or biliary dilatation Pancreas: Unremarkable. No pancreatic ductal dilatation or surrounding inflammatory  changes. Spleen: Normal in size without focal abnormality. Adrenals/Urinary Tract: Adrenal glands are within normal limits. There are multiple bilateral intrarenal calculi. Largest stone on the right is seen within the midpole and measures up to 13 mm on the axial images. Largest stone on the left is also seen in the midpole and measures up to 7 mm on the axial images. Moderate left perinephric fat stranding. There is mild to moderate left hydronephrosis and hydroureter, secondary to a 6 mm stone within the distal left ureter, several cm proximal to the left UVJ. The bladder is unremarkable. Stomach/Bowel: Stomach is nonenlarged. There is no dilated small bowel.  No colon wall thickening. Vascular/Lymphatic: Aortic atherosclerosis. No enlarged abdominal or pelvic lymph nodes. Reproductive: Small prostate gland calcification. Slightly enlarged prostate. Other: Mild soft tissue thickening in the proximal right inguinal canal is unchanged. There is no free air or free fluid. Musculoskeletal: Scoliosis with multilevel severe degenerative changes. No acute or suspicious bone lesion. IMPRESSION: 1. Left perinephric fat stranding. Mild to moderate left hydronephrosis and hydroureter, secondary to a 6 mm stone in the distal left ureter, several cm proximal to the left UVJ. 2. 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   On: 09/04/2016 02:37    ED COURSE  Nursing notes and initial vitals signs, including pulse oximetry, reviewed.  Vitals:   09/04/16 0100 09/04/16 0130 09/04/16 0200 09/04/16 0249  BP: (!) 177/98 (!) 175/97 (!) 171/102 140/61  Pulse: 84 85 84 (!) 50  Resp:  Temp:    98.6 F (37 C)  TempSrc:    Oral  SpO2: 95% 95% 95% 95%  Weight:      Height:       3:17 AM Pain relieved with IV medications. Patient has a urologist with whom he can follow-up.  PROCEDURES    ED DIAGNOSES     ICD-9-CM  ICD-10-CM   1. Ureterolithiasis 592.1 N20.1        Paula Libra, MD 09/04/16 4332    Paula Libra, MD 09/04/16 9518

## 2017-06-15 IMAGING — CT CT RENAL STONE PROTOCOL
2 of 4 series · 16 of 46 positions shown, 18 images · non-contrast
Comparison: 01/01/2015

CLINICAL DATA: Left flank pain for 1 day

EXAM:
CT ABDOMEN AND PELVIS WITHOUT CONTRAST
TECHNIQUE: Multidetector CT imaging of the abdomen and pelvis was performed
following the standard protocol without IV contrast.

[Series 2: axial st · axial · 0.88mm/px · z∈[-363,+67]mm · 13 of 94 slices shown, 15 images]
[im 4/94  soft-tissue]
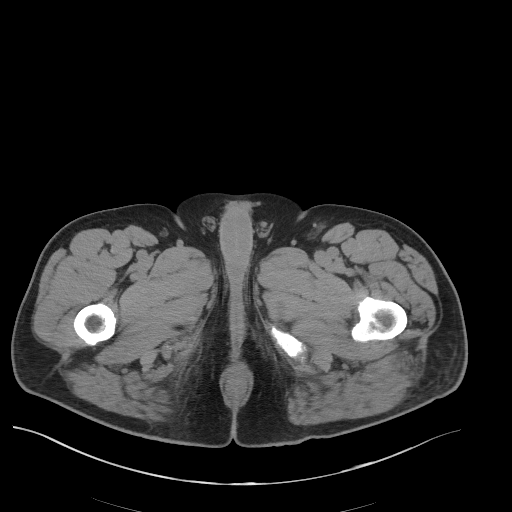
[im 4/94  bone]
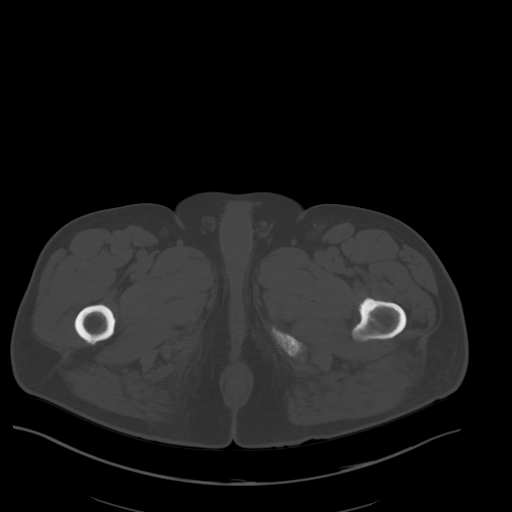
[im 12/94  soft-tissue]
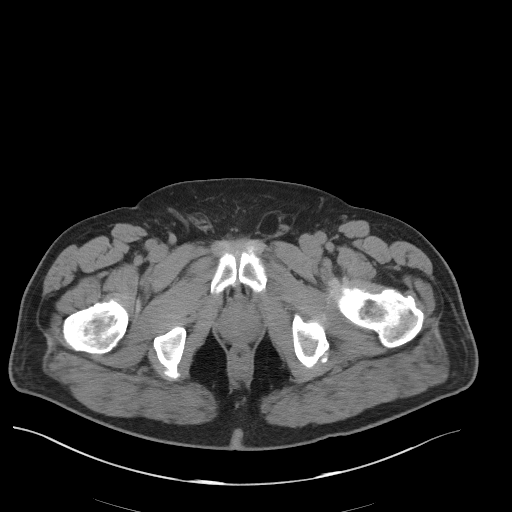
[im 20/94  soft-tissue]
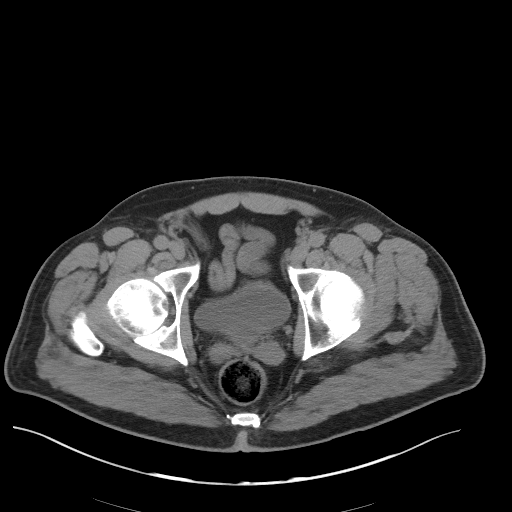
[im 28/94  soft-tissue]
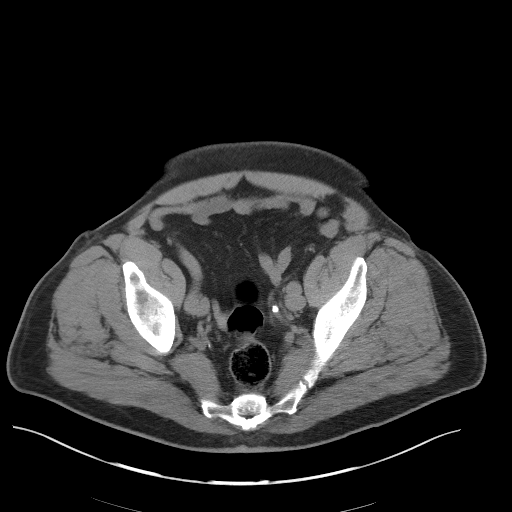
[im 32/94  soft-tissue]
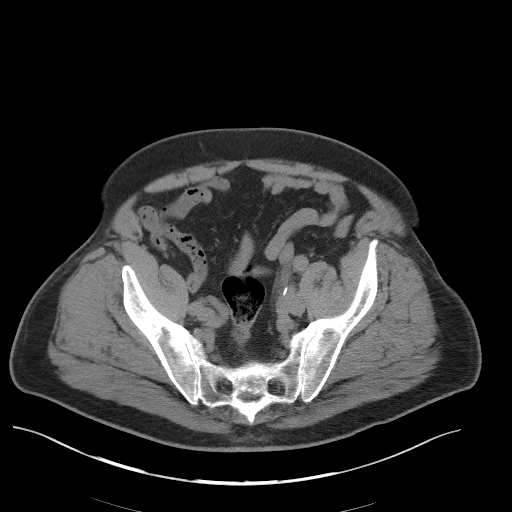
[im 39/94  soft-tissue]
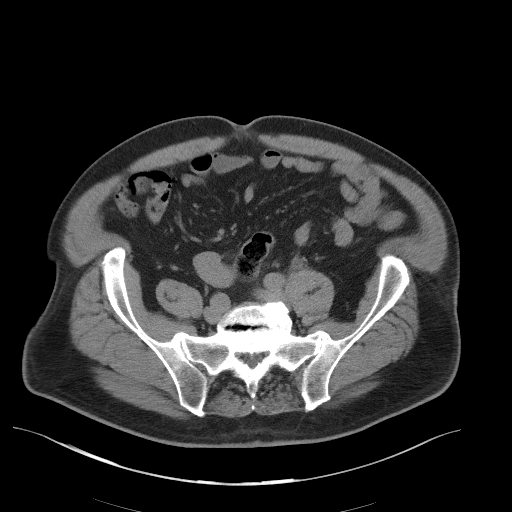
[im 47/94  soft-tissue]
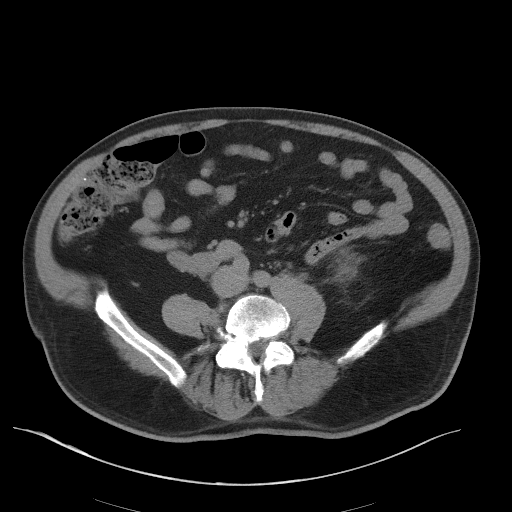
[im 55/94  soft-tissue]
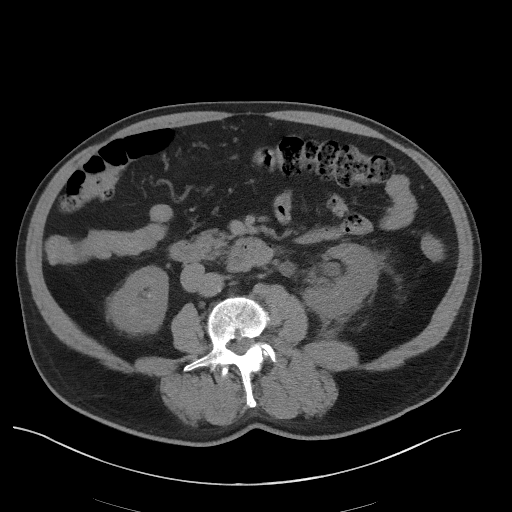
[im 63/94  soft-tissue]
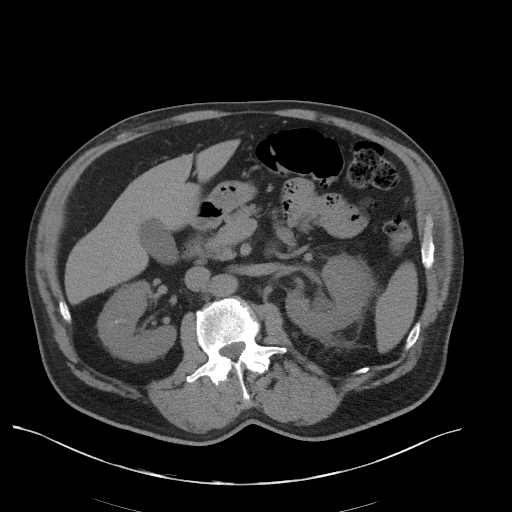
[im 63/94  bone]
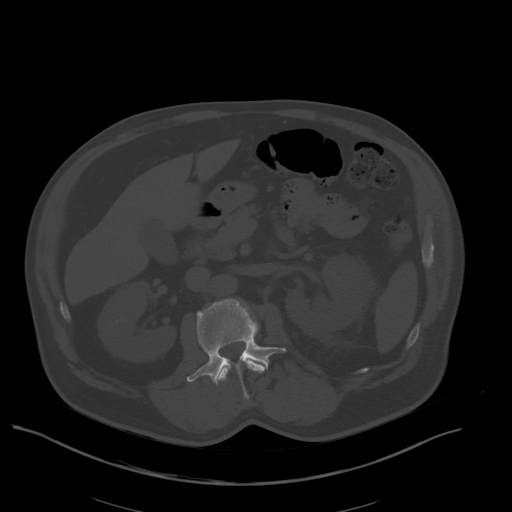
[im 66/94  soft-tissue]
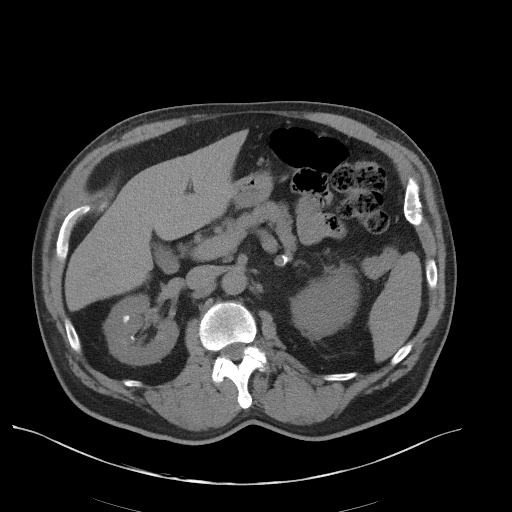
[im 74/94  soft-tissue]
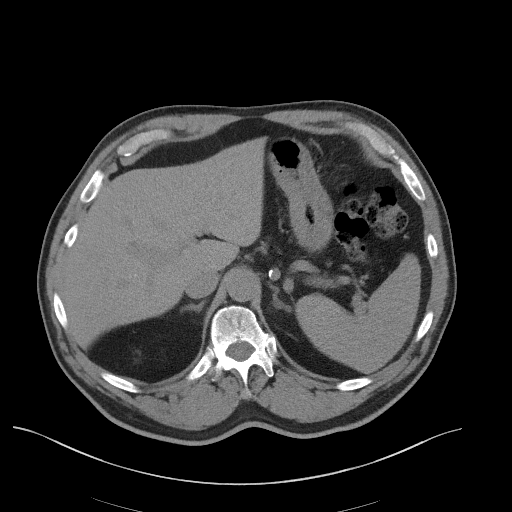
[im 82/94  soft-tissue]
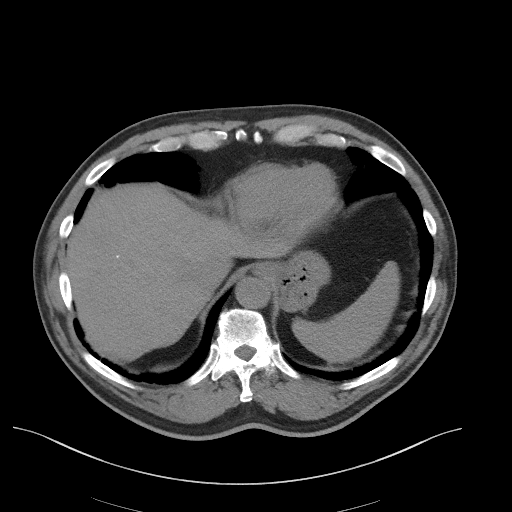
[im 90/94  soft-tissue]
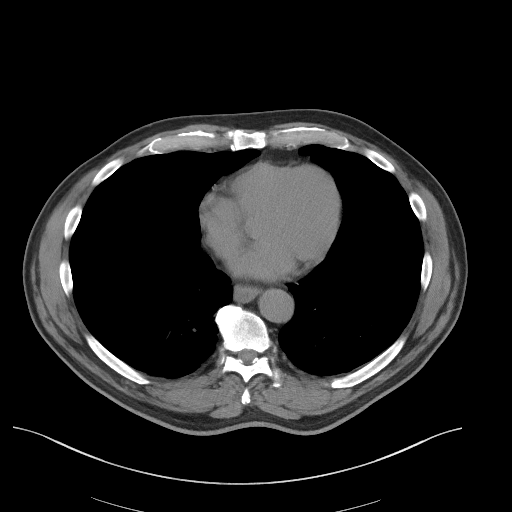

[Series 4: coronal st · coronal · 0.86mm/px · 3 of 101 slices shown]
[im 34/101  soft-tissue]
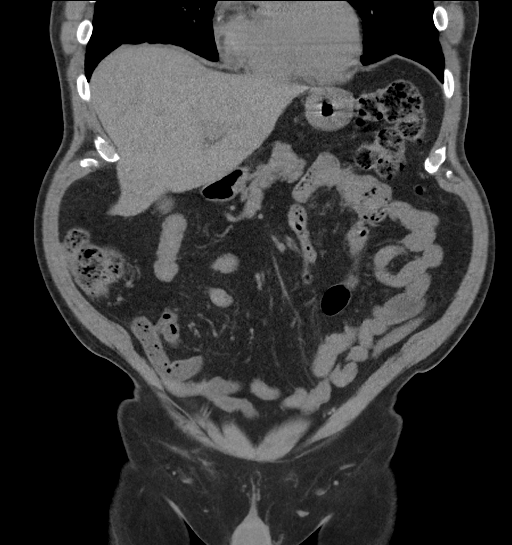
[im 45/101  soft-tissue]
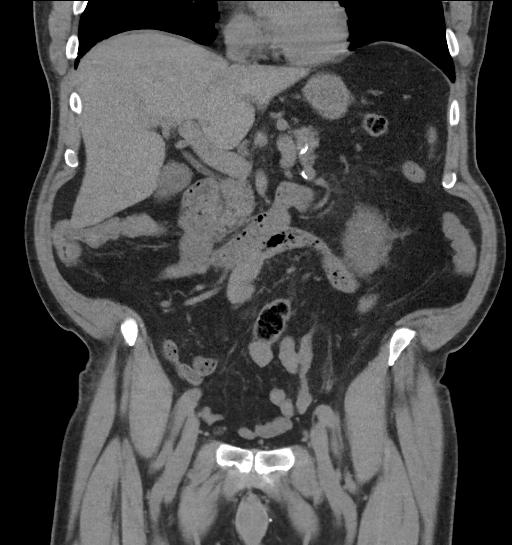
[im 56/101  soft-tissue]
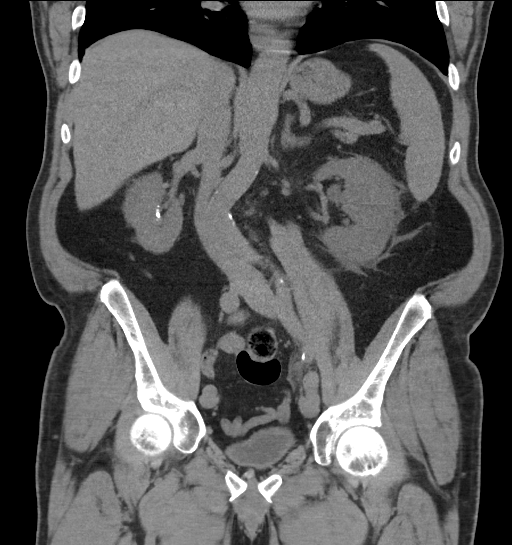

[16 of 46 positions shown; findings below may reference images not displayed]

FINDINGS: Lower chest: Lung bases demonstrate no acute infiltrate or effusion.
Normal heart size. Coronary artery calcification.

Hepatobiliary: Calcified granuloma. No calcified gallstones or
biliary dilatation

Pancreas: Unremarkable. No pancreatic ductal dilatation or
surrounding inflammatory changes.

Spleen: Normal in size without focal abnormality.

Adrenals/Urinary Tract: Adrenal glands are within normal limits.
There are multiple bilateral intrarenal calculi. Largest stone on
the right is seen within the midpole and measures up to 13 mm on the
axial images. Largest stone on the left is also seen in the midpole
and measures up to 7 mm on the axial images.

Moderate left perinephric fat stranding. There is mild to moderate
left hydronephrosis and hydroureter, secondary to a 6 mm stone
within the distal left ureter, several cm proximal to the left UVJ.
The bladder is unremarkable.

Stomach/Bowel: Stomach is nonenlarged. There is no dilated small
bowel. No colon wall thickening.

Vascular/Lymphatic: Aortic atherosclerosis. No enlarged abdominal or
pelvic lymph nodes.

Reproductive: Small prostate gland calcification. Slightly enlarged
prostate.

Other: Mild soft tissue thickening in the proximal right inguinal
canal is unchanged. There is no free air or free fluid.

Musculoskeletal: Scoliosis with multilevel severe degenerative
changes. No acute or suspicious bone lesion.
IMPRESSION: 1. Left perinephric fat stranding. Mild to moderate left
hydronephrosis and hydroureter, secondary to a 6 mm stone in the
distal left ureter, several cm proximal to the left UVJ.
2. There are multiple bilateral intrarenal calculi
3. Old granulomatous disease of the liver

## 2020-02-11 ENCOUNTER — Ambulatory Visit: Payer: BLUE CROSS/BLUE SHIELD

## 2022-08-18 ENCOUNTER — Encounter (HOSPITAL_BASED_OUTPATIENT_CLINIC_OR_DEPARTMENT_OTHER): Payer: Self-pay

## 2022-08-18 ENCOUNTER — Emergency Department (HOSPITAL_BASED_OUTPATIENT_CLINIC_OR_DEPARTMENT_OTHER): Payer: Medicare Other

## 2022-08-18 ENCOUNTER — Other Ambulatory Visit: Payer: Self-pay

## 2022-08-18 ENCOUNTER — Emergency Department (HOSPITAL_BASED_OUTPATIENT_CLINIC_OR_DEPARTMENT_OTHER)
Admission: EM | Admit: 2022-08-18 | Discharge: 2022-08-18 | Disposition: A | Discharge status: Home / Self Care | Payer: Medicare Other | Attending: Emergency Medicine | Admitting: Emergency Medicine

## 2022-08-18 DIAGNOSIS — Z79899 Other long term (current) drug therapy: Secondary | ICD-10-CM | POA: Diagnosis not present

## 2022-08-18 DIAGNOSIS — L03113 Cellulitis of right upper limb: Secondary | ICD-10-CM | POA: Diagnosis not present

## 2022-08-18 DIAGNOSIS — R2231 Localized swelling, mass and lump, right upper limb: Secondary | ICD-10-CM | POA: Diagnosis present

## 2022-08-18 DIAGNOSIS — I10 Essential (primary) hypertension: Secondary | ICD-10-CM | POA: Insufficient documentation

## 2022-08-18 LAB — CBC WITH DIFFERENTIAL/PLATELET
Abs Immature Granulocytes: 0.03 10*3/uL (ref 0.00–0.07)
Basophils Absolute: 0 10*3/uL (ref 0.0–0.1)
Basophils Relative: 0 %
Eosinophils Absolute: 0.4 10*3/uL (ref 0.0–0.5)
Eosinophils Relative: 7 %
HCT: 38 % — ABNORMAL LOW (ref 39.0–52.0)
Hemoglobin: 13.3 g/dL (ref 13.0–17.0)
Immature Granulocytes: 1 %
Lymphocytes Relative: 21 %
Lymphs Abs: 1.3 10*3/uL (ref 0.7–4.0)
MCH: 33 pg (ref 26.0–34.0)
MCHC: 35 g/dL (ref 30.0–36.0)
MCV: 94.3 fL (ref 80.0–100.0)
Monocytes Absolute: 0.5 10*3/uL (ref 0.1–1.0)
Monocytes Relative: 8 %
Neutro Abs: 3.9 10*3/uL (ref 1.7–7.7)
Neutrophils Relative %: 63 %
Platelets: 181 10*3/uL (ref 150–400)
RBC: 4.03 MIL/uL — ABNORMAL LOW (ref 4.22–5.81)
RDW: 12.3 % (ref 11.5–15.5)
WBC: 6.2 10*3/uL (ref 4.0–10.5)
nRBC: 0 % (ref 0.0–0.2)

## 2022-08-18 LAB — URINALYSIS, MICROSCOPIC (REFLEX): Squamous Epithelial / HPF: NONE SEEN /HPF (ref 0–5)

## 2022-08-18 LAB — URINALYSIS, ROUTINE W REFLEX MICROSCOPIC
Bilirubin Urine: NEGATIVE
Glucose, UA: NEGATIVE mg/dL
Hgb urine dipstick: NEGATIVE
Ketones, ur: NEGATIVE mg/dL
Leukocytes,Ua: NEGATIVE
Nitrite: NEGATIVE
Protein, ur: 100 mg/dL — AB
Specific Gravity, Urine: 1.025 (ref 1.005–1.030)
pH: 5.5 (ref 5.0–8.0)

## 2022-08-18 LAB — COMPREHENSIVE METABOLIC PANEL
ALT: 31 U/L (ref 0–44)
AST: 27 U/L (ref 15–41)
Albumin: 4.1 g/dL (ref 3.5–5.0)
Alkaline Phosphatase: 57 U/L (ref 38–126)
Anion gap: 10 (ref 5–15)
BUN: 34 mg/dL — ABNORMAL HIGH (ref 8–23)
CO2: 23 mmol/L (ref 22–32)
Calcium: 9.8 mg/dL (ref 8.9–10.3)
Chloride: 104 mmol/L (ref 98–111)
Creatinine, Ser: 1.3 mg/dL — ABNORMAL HIGH (ref 0.61–1.24)
GFR, Estimated: 56 mL/min — ABNORMAL LOW (ref >60)
Glucose, Bld: 101 mg/dL — ABNORMAL HIGH (ref 70–99)
Potassium: 4.7 mmol/L (ref 3.5–5.1)
Sodium: 137 mmol/L (ref 135–145)
Total Bilirubin: 0.7 mg/dL (ref 0.3–1.2)
Total Protein: 8.2 g/dL — ABNORMAL HIGH (ref 6.5–8.1)

## 2022-08-18 LAB — LACTIC ACID, PLASMA: Lactic Acid, Venous: 1.1 mmol/L (ref 0.5–1.9)

## 2022-08-18 MED ORDER — CEPHALEXIN 500 MG PO CAPS: 500.0000 mg | ORAL_CAPSULE | Freq: Four times a day (QID) | ORAL | 0 refills | Status: DC

## 2022-08-18 MED ORDER — CEPHALEXIN 250 MG PO CAPS
500.0000 mg | ORAL_CAPSULE | Freq: Once | ORAL | Status: AC
  Administered 2022-08-18: 500 mg via ORAL
  Filled 2022-08-18: qty 2

## 2022-08-18 NOTE — ED Provider Notes (Signed)
[facility] Provider Note   CSN: [id number] Arrival date & time: 08/18/22  1317     History  Chief Complaint  Patient presents with   Joint Swelling    Ernest Daniels is a 82 y.o. male.  Patient presents to the emergency department complaining of right elbow swelling and redness.  He states that he went to the urgent care earlier today for evaluation and they recommended he come to the emergency department.  The patient denies any known injury to the area.  It has been ongoing for approximately 1 week.  He states he was at [employer] and he woke up noticing redness and some mild swelling.  He was concerned for a possible spider bite at the time.  He denies any pain to palpation or movement of the joint.  Denies fevers, nausea, vomiting.  Past medical history significant for hypertension  HPI     Home Medications Prior to Admission medications   Medication Sig Start Date End Date Taking? Authorizing Provider  cephALEXin (KEFLEX) 500 MG capsule Take 1 capsule (500 mg total) by mouth 4 (four) times daily. 08/18/22  Yes Cherlynn June B, PA-C  Alum & Mag Hydroxide-Simeth (MAGIC MOUTHWASH W/LIDOCAINE) SOLN Take 10 mLs by mouth 4 (four) times daily as needed. 09/19/12   Orpah Greek, MD  amLODipine (NORVASC) 5 MG tablet Take 5 mg by mouth daily.    [provider]  BUPROPION HCL ER, SR, PO Take 150 mg by mouth.    [provider]  diazepam (VALIUM) 5 MG tablet Take 1 tablet (5 mg total) by mouth every 6 (six) hours as needed (spasm). 11/28/12   Davonna Belling, MD  diclofenac (VOLTAREN) 25 MG EC tablet Take 25 mg by mouth 2 (two) times daily.    [provider]  diphenhydrAMINE (BENADRYL) 50 MG tablet Take 50 mg by mouth at bedtime as needed for itching.    [provider]  glucosamine-chondroitin 500-400 MG tablet Take 1 tablet by mouth 3 (three) times daily.    [provider]  Multiple  Vitamins-Minerals (CENTRUM SILVER PO) Take by mouth.    [provider]  NIACIN CR PO Take by mouth.    [provider]  ondansetron (ZOFRAN ODT) 8 MG disintegrating tablet Take 1 tablet (8 mg total) by mouth every 8 (eight) hours as needed for nausea or vomiting. 09/04/16   Molpus, Jenny Reichmann, MD  oxyCODONE-acetaminophen (PERCOCET) 5-325 MG tablet Take 1 tablet by mouth every 4 (four) hours as needed (for pain). 09/04/16   Molpus, John, MD  potassium citrate (UROCIT-K) 10 MEQ (1080 MG) SR tablet Take 10 mEq by mouth 3 (three) times daily with meals.    [provider]  simvastatin (ZOCOR) 40 MG tablet Take 40 mg by mouth every evening.    [provider]  tamsulosin (FLOMAX) 0.4 MG CAPS capsule Take one capsule daily until stone passes. 09/04/16   Molpus, Jenny Reichmann, MD      Allergies    Patient has no known allergies.    Review of Systems   Review of Systems  Physical Exam Updated Vital Signs BP (!) 151/85   Pulse (!) 58   Temp 97.7 F (36.5 C) (Oral)   Resp 19   Ht 5\' 8"  (1.727 m)   Wt 99.8 kg   SpO2 97%   BMI 33.45 kg/m  Physical Exam HENT:     Head: Normocephalic and atraumatic.  Mouth/Throat:     Mouth: Mucous membranes are moist.  Eyes:     Conjunctiva/sclera: Conjunctivae normal.  Cardiovascular:     Rate and Rhythm: Normal rate.  Pulmonary:     Effort: Pulmonary effort is normal. No respiratory distress.  Musculoskeletal:        General: Swelling present. No tenderness or signs of injury. Normal range of motion.     Cervical back: Normal range of motion.     Comments: Swelling noted in the right elbow in the region of the olecranon bursa  Skin:    General: Skin is warm and dry.     Capillary Refill: Capillary refill takes less than 2 seconds.     Findings: Erythema present.     Comments: Erythema surrounding the right elbow, traveling downward into the anterior forearm, area is warm  Neurological:     Mental Status: He is alert.   Psychiatric:        Speech: Speech normal.        Behavior: Behavior normal.     ED Results / Procedures / Treatments   Labs (all labs ordered are listed, but only abnormal results are displayed) Labs Reviewed  COMPREHENSIVE METABOLIC PANEL - Abnormal; Notable for the following components:      Result Value   Glucose, Bld 101 (*)    BUN 34 (*)    Creatinine, Ser 1.30 (*)    Total Protein 8.2 (*)    GFR, Estimated 56 (*)    All other components within normal limits  CBC WITH DIFFERENTIAL/PLATELET - Abnormal; Notable for the following components:   RBC 4.03 (*)    HCT 38.0 (*)    All other components within normal limits  URINALYSIS, ROUTINE W REFLEX MICROSCOPIC - Abnormal; Notable for the following components:   Protein, ur 100 (*)    All other components within normal limits  URINALYSIS, MICROSCOPIC (REFLEX) - Abnormal; Notable for the following components:   Bacteria, UA RARE (*)    All other components within normal limits  LACTIC ACID, PLASMA    EKG None  Radiology DG Elbow Complete Right  Result Date: 08/18/2022 CLINICAL DATA:  Pain and swelling EXAM: RIGHT ELBOW - COMPLETE 4 VIEW COMPARISON:  None Available. FINDINGS: Olecranon spur. Olecranon soft tissue swelling consistent with hematoma or bursitis. No acute fracture, dislocation or subluxation. No joint hemarthrosis. IMPRESSION: Olecranon spur with evidence of bursitis or hematoma. No acute osseous abnormalities. Electronically Signed   By: Sammie Bench M.D.   On: 08/18/2022 14:22    Procedures Procedures    Medications Ordered in ED Medications  cephALEXin (KEFLEX) capsule 500 mg (500 mg Oral Given 08/18/22 1637)    ED Course/ Medical Decision Making/ A&P                             Medical Decision Making Amount and/or Complexity of Data Reviewed Labs: ordered. Radiology: ordered.   Patient presents with chief complaint of right elbow swelling.  Differential diagnosis includes but is not limited  to cellulitis, olecranon bursitis, septic arthritis, and others  I ordered and reviewed labs.  Pertinent results include creatinine 1.3 (at baseline), UA not unremarkable, CBC unremarkable with no leukocytosis  I ordered and interpreted imaging including plain films of the right elbow. Olecranon spur with evidence of bursitis or hematoma. No acute  osseous abnormalities.  I agree with the radiologist findings  I ordered the patient Keflex for skin infection  coverage  Considered olecranon bursitis but patient has no pain, no known injury.  Feel this is unlikely.  Very low clinical suspicion of septic arthritis with full range of motion and no pain.  Patient also has unremarkable labs and no fever, no systemic symptoms.  Presentation is most consistent at this time with cellulitis.  Plan to discharge home with course of cellulitis.  Patient given return precautions including worsening erythema, systemic symptoms.  Patient will follow-up as needed with his primary care provider.       Final Clinical Impression(s) / ED Diagnoses Final diagnoses:  Cellulitis of right upper extremity    Rx / DC Orders ED Discharge Orders          Ordered    cephALEXin (KEFLEX) 500 MG capsule  4 times daily        08/18/22 1618              Ronny Bacon 08/18/22 1639    Tegeler, Gwenyth Allegra, MD 08/18/22 1700

## 2022-08-18 NOTE — Discharge Instructions (Signed)
You were diagnosed today with cellulitis of the right elbow.  Please take the prescribed antibiotics until complete.  You may take over-the-counter medication as needed if you begin to develop pain.  If you continue have symptoms after the redness and warmth have subsided you could consider compression and ice over the area with anti-inflammatory medication.  If the redness spreads after taking antibiotics or you develop systemic symptoms such as a fever, nausea, or vomiting, please return to the emergency department.

## 2022-08-18 NOTE — ED Triage Notes (Signed)
Patient arrives to ED POV C/O RT Elbow pain and swelling. Per pt this has been going on x1 week and went to UC today and was instructed to come to ED. Redness and swelling noted. No other complaints at this time. Pt A/O x4.

## 2022-09-09 ENCOUNTER — Ambulatory Visit (HOSPITAL_BASED_OUTPATIENT_CLINIC_OR_DEPARTMENT_OTHER)
Admission: RE | Admit: 2022-09-09 | Discharge: 2022-09-09 | Disposition: A | Discharge status: Home / Self Care | Payer: Medicare Other | Source: Ambulatory Visit | Attending: Urology | Admitting: Urology

## 2022-09-09 ENCOUNTER — Ambulatory Visit: Payer: Medicare Other | Admitting: Urology

## 2022-09-09 ENCOUNTER — Encounter: Payer: Self-pay | Admitting: Urology

## 2022-09-09 VITALS — BP 127/78 | HR 73 | Ht 68.0 in | Wt 215.0 lb

## 2022-09-09 DIAGNOSIS — N200 Calculus of kidney: Secondary | ICD-10-CM | POA: Insufficient documentation

## 2022-09-09 LAB — URINALYSIS, ROUTINE W REFLEX MICROSCOPIC
Bilirubin, UA: NEGATIVE
Glucose, UA: NEGATIVE
Nitrite, UA: NEGATIVE
RBC, UA: NEGATIVE
Specific Gravity, UA: 1.025 (ref 1.005–1.030)
Urobilinogen, Ur: 1 mg/dL (ref 0.2–1.0)
pH, UA: 5.5 (ref 5.0–7.5)

## 2022-09-09 LAB — MICROSCOPIC EXAMINATION
Crystal Type: NONE SEEN
Crystals: NONE SEEN
RBC, Urine: NONE SEEN /hpf (ref 0–2)
Trichomonas, UA: NONE SEEN
Yeast, UA: NONE SEEN

## 2022-09-09 MED ORDER — POTASSIUM CITRATE ER 15 MEQ (1620 MG) PO TBCR
1.0000 | EXTENDED_RELEASE_TABLET | Freq: Every day | ORAL | 3 refills | Status: DC
Start: 2022-09-09 — End: 2023-10-12

## 2022-09-09 NOTE — Progress Notes (Signed)
Assessment: 1. Nephrolithiasis    Plan: I personally reviewed the patient's chart including provider notes, lab and imaging results. I reviewed records from urology at [facility]. KUB ordered today. Continue potassium citrate 15 mEq daily.  Refill provided. Return to office in 1 year. Recommend follow-up with PCP for abnormal lab results.   Chief Complaint:  Chief Complaint  Patient presents with   Nephrolithiasis    History of Present Illness:  Ernest Daniels is a 82 y.o. male who is seen for evaluation of nephrolithiasis. He was previously followed at [facility] in [facility].  He was last seen there in February 2023. He has a history of recurrent nephrolithiasis.  He has passed multiple stones.  He underwent ureteroscopic stone manipulation April 2018.  He has been managed with potassium citrate. He was last seen by Dr. Sabino Gasser in February 2023.  He reported passing a small stone at that time. KUB from 2/23 showed bilateral renal calcifications.  He presents today to establish care here.  He has not had any recent stone symptoms.  No flank pain.  No dysuria or gross hematuria.  He does report occasional splitting of his stream.  No recent imaging.  He continues on potassium citrate 15 mill equivalents daily. IPSS = 3 today. K 4.7  Cr 1.89  09/08/22  Past Medical History:  Past Medical History:  Diagnosis Date   Hypertension    Kidney stone     Past Surgical History:  Past Surgical History:  Procedure Laterality Date   KNEE SURGERY  2010   replacement    Allergies:  No Known Allergies  Family History:  Family History  Problem Relation Age of Onset   Hypertension Mother    Diabetes Neg Hx    Heart attack Neg Hx    Hyperlipidemia Neg Hx    Sudden death Neg Hx     Social History:  Social History   Tobacco Use   Smoking status: Never  Substance Use Topics   Alcohol use: No   Drug use: No    Review of symptoms:  Constitutional:  Negative  for unexplained weight loss, night sweats, fever, chills ENT:  Negative for nose bleeds, sinus pain, painful swallowing CV:  Negative for chest pain, shortness of breath, exercise intolerance, palpitations, loss of consciousness Resp:  Negative for cough, wheezing, shortness of breath GI:  Negative for nausea, vomiting, diarrhea, bloody stools GU:  Positives noted in HPI; otherwise negative for gross hematuria, dysuria, urinary incontinence Neuro:  Negative for seizures, poor balance, limb weakness, slurred speech Psych:  Negative for lack of energy, depression, anxiety Endocrine:  Negative for polydipsia, polyuria, symptoms of hypoglycemia (dizziness, hunger, sweating) Hematologic:  Negative for anemia, purpura, petechia, prolonged or excessive bleeding, use of anticoagulants  Allergic:  Negative for difficulty breathing or choking as a result of exposure to anything; no shellfish allergy; no allergic response (rash/itch) to materials, foods  Physical exam: BP 127/78   Pulse 73   Ht 5\' 8"  (1.727 m)   Wt 215 lb (97.5 kg)   BMI 32.69 kg/m  GENERAL APPEARANCE:  Well appearing, well developed, well nourished, NAD HEENT: Atraumatic, Normocephalic, oropharynx clear. NECK: Supple without lymphadenopathy or thyromegaly. LUNGS: Clear to auscultation bilaterally. HEART: Regular Rate and Rhythm without murmurs, gallops, or rubs. ABDOMEN: Soft, non-tender, No Masses. EXTREMITIES: Moves all extremities well.  Without clubbing, cyanosis, or edema. NEUROLOGIC:  Alert and oriented x 3, normal gait, CN II-XII grossly intact.  MENTAL STATUS:  Appropriate.  BACK:  Non-tender to palpation.  No CVAT SKIN:  Warm, dry and intact.    Results: U/A:  6-10 WBC, pH 5.5

## 2023-09-11 ENCOUNTER — Ambulatory Visit: Payer: Medicare Other | Admitting: Urology

## 2023-10-12 ENCOUNTER — Encounter: Payer: Self-pay | Admitting: Urology

## 2023-10-12 ENCOUNTER — Ambulatory Visit: Admitting: Urology

## 2023-10-12 VITALS — BP 116/70 | HR 74 | Ht 68.0 in | Wt 215.0 lb

## 2023-10-12 DIAGNOSIS — N200 Calculus of kidney: Secondary | ICD-10-CM

## 2023-10-12 LAB — URINALYSIS, ROUTINE W REFLEX MICROSCOPIC
Bilirubin, UA: NEGATIVE
Glucose, UA: NEGATIVE
Leukocytes,UA: NEGATIVE
Nitrite, UA: NEGATIVE
RBC, UA: NEGATIVE
Specific Gravity, UA: 1.02 (ref 1.005–1.030)
Urobilinogen, Ur: 1 mg/dL (ref 0.2–1.0)
pH, UA: 5.5 (ref 5.0–7.5)

## 2023-10-12 LAB — MICROSCOPIC EXAMINATION

## 2023-10-12 MED ORDER — POTASSIUM CITRATE ER 15 MEQ (1620 MG) PO TBCR
1.0000 | EXTENDED_RELEASE_TABLET | Freq: Every day | ORAL | 3 refills | Status: AC
Start: 2023-10-12 — End: ?

## 2023-10-12 MED ORDER — POTASSIUM CITRATE ER 15 MEQ (1620 MG) PO TBCR: 1.0000 | EXTENDED_RELEASE_TABLET | Freq: Every day | ORAL | 3 refills | Status: DC

## 2023-10-12 NOTE — Progress Notes (Signed)
   Assessment: 1. Nephrolithiasis     Plan: Continue potassium citrate  15 mEq daily.  Refill provided. Return to office in 1 year.  Chief Complaint:  Chief Complaint  Patient presents with   Nephrolithiasis    History of Present Illness:  Ernest Daniels is a 83 y.o. male who is seen for continued evaluation of nephrolithiasis. He was previously followed at [facility] in [facility].  He was last seen there in February 2023. He has a history of recurrent nephrolithiasis.  He has passed multiple stones.  He underwent ureteroscopic stone manipulation April 2018.  He has been managed with potassium citrate . He was last seen by Dr. Doyne Genin in February 2023.  He reported passing a small stone at that time. KUB from 2/23 showed bilateral renal calcifications.  At his last visit in April 2024, he reported no recent stone symptoms.  No flank pain.  No dysuria or gross hematuria.  He noted occasional splitting of his stream.   He continued on potassium citrate  15 mEq daily. IPSS = 3. K 4.7  Cr 1.89  09/08/22  KUB from 4/24 showed bilateral renal calcifications measuring up to 11 mm on the right.  PSA 10/24:  0.91 Labs from 4/25: Cr 1.3 K 4.1  He returns today for follow-up.  He has not had any recent kidney stone symptoms.  No flank pain, dysuria, or gross hematuria.  He continues on potassium citrate  15 mEq daily.   IPSS = 5.  Portions of the above documentation were copied from a prior visit for review purposes only.   Past Medical History:  Past Medical History:  Diagnosis Date   Hypertension    Kidney stone     Past Surgical History:  Past Surgical History:  Procedure Laterality Date   KNEE SURGERY  2010   replacement    Allergies:  No Known Allergies  Family History:  Family History  Problem Relation Age of Onset   Hypertension Mother    Diabetes Neg Hx    Heart attack Neg Hx    Hyperlipidemia Neg Hx    Sudden death Neg Hx     Social History:   Social History   Tobacco Use   Smoking status: Never  Substance Use Topics   Alcohol use: No   Drug use: No    ROS: Constitutional:  Negative for fever, chills, weight loss CV: Negative for chest pain, previous MI, hypertension Respiratory:  Negative for shortness of breath, wheezing, sleep apnea, frequent cough GI:  Negative for nausea, vomiting, bloody stool, GERD  Physical exam: BP 116/70   Pulse 74   Ht 5\' 8"  (1.727 m)   Wt 215 lb (97.5 kg)   BMI 32.69 kg/m  GENERAL APPEARANCE:  Well appearing, well developed, well nourished, NAD HEENT:  Atraumatic, normocephalic, oropharynx clear NECK:  Supple without lymphadenopathy or thyromegaly ABDOMEN:  Soft, non-tender, no masses EXTREMITIES:  Moves all extremities well, without clubbing, cyanosis, or edema NEUROLOGIC:  Alert and oriented x 3, normal gait, CN II-XII grossly intact MENTAL STATUS:  appropriate BACK:  Non-tender to palpation, No CVAT SKIN:  Warm, dry, and intact  Results: U/A: pH 5.5, 6/10 WBCs, 0-2 RBCs

## 2024-10-10 ENCOUNTER — Ambulatory Visit: Admitting: Urology
